# Patient Record
Sex: Male | Born: 1955 | Race: White | Hispanic: No | Smoking: Never smoker
Health system: Southern US, Community
[De-identification: ages and names within clinical notes are randomized; demographics above are authoritative.]

## PROBLEM LIST (undated history)

## (undated) DIAGNOSIS — C9500 Acute leukemia of unspecified cell type not having achieved remission: Secondary | ICD-10-CM

## (undated) DIAGNOSIS — E785 Hyperlipidemia, unspecified: Secondary | ICD-10-CM

## (undated) DIAGNOSIS — E119 Type 2 diabetes mellitus without complications: Secondary | ICD-10-CM

## (undated) DIAGNOSIS — I10 Essential (primary) hypertension: Secondary | ICD-10-CM

## (undated) HISTORY — PX: KNEE SURGERY: SHX244

## (undated) HISTORY — PX: SHOULDER SURGERY: SHX246

---

## 2015-12-13 ENCOUNTER — Emergency Department (HOSPITAL_BASED_OUTPATIENT_CLINIC_OR_DEPARTMENT_OTHER)
Admission: EM | Admit: 2015-12-13 | Discharge: 2015-12-14 | Disposition: A | Discharge status: Home / Self Care | Payer: Self-pay | Attending: Emergency Medicine | Admitting: Emergency Medicine

## 2015-12-13 ENCOUNTER — Emergency Department (HOSPITAL_BASED_OUTPATIENT_CLINIC_OR_DEPARTMENT_OTHER): Payer: Self-pay

## 2015-12-13 ENCOUNTER — Encounter (HOSPITAL_BASED_OUTPATIENT_CLINIC_OR_DEPARTMENT_OTHER): Payer: Self-pay | Admitting: Emergency Medicine

## 2015-12-13 DIAGNOSIS — Z7984 Long term (current) use of oral hypoglycemic drugs: Secondary | ICD-10-CM | POA: Insufficient documentation

## 2015-12-13 DIAGNOSIS — Z856 Personal history of leukemia: Secondary | ICD-10-CM | POA: Insufficient documentation

## 2015-12-13 DIAGNOSIS — J069 Acute upper respiratory infection, unspecified: Secondary | ICD-10-CM | POA: Insufficient documentation

## 2015-12-13 DIAGNOSIS — E119 Type 2 diabetes mellitus without complications: Secondary | ICD-10-CM | POA: Insufficient documentation

## 2015-12-13 DIAGNOSIS — E785 Hyperlipidemia, unspecified: Secondary | ICD-10-CM | POA: Insufficient documentation

## 2015-12-13 DIAGNOSIS — I10 Essential (primary) hypertension: Secondary | ICD-10-CM | POA: Insufficient documentation

## 2015-12-13 DIAGNOSIS — R000 Tachycardia, unspecified: Secondary | ICD-10-CM | POA: Insufficient documentation

## 2015-12-13 DIAGNOSIS — Z79899 Other long term (current) drug therapy: Secondary | ICD-10-CM | POA: Insufficient documentation

## 2015-12-13 DIAGNOSIS — Z794 Long term (current) use of insulin: Secondary | ICD-10-CM | POA: Insufficient documentation

## 2015-12-13 DIAGNOSIS — R062 Wheezing: Secondary | ICD-10-CM | POA: Insufficient documentation

## 2015-12-13 HISTORY — DX: Essential (primary) hypertension: I10

## 2015-12-13 HISTORY — DX: Hyperlipidemia, unspecified: E78.5

## 2015-12-13 HISTORY — DX: Acute leukemia of unspecified cell type not having achieved remission: C95.00

## 2015-12-13 HISTORY — DX: Type 2 diabetes mellitus without complications: E11.9

## 2015-12-13 LAB — URINE MICROSCOPIC-ADD ON

## 2015-12-13 LAB — URINALYSIS, ROUTINE W REFLEX MICROSCOPIC
Bilirubin Urine: NEGATIVE
Hgb urine dipstick: NEGATIVE
KETONES UR: NEGATIVE mg/dL
LEUKOCYTES UA: NEGATIVE
NITRITE: NEGATIVE
PH: 5.5 (ref 5.0–8.0)
Protein, ur: NEGATIVE mg/dL
SPECIFIC GRAVITY, URINE: 1.044 — AB (ref 1.005–1.030)

## 2015-12-13 MED ORDER — IPRATROPIUM-ALBUTEROL 0.5-2.5 (3) MG/3ML IN SOLN
3.0000 mL | Freq: Four times a day (QID) | RESPIRATORY_TRACT | Status: DC
  Administered 2015-12-13: 3 mL via RESPIRATORY_TRACT
  Filled 2015-12-13: qty 3

## 2015-12-13 NOTE — ED Provider Notes (Signed)
CSN: [id number]     Arrival date & time 12/13/15  2037 History   First MD Initiated Contact with Patient 12/13/15 2300     Chief Complaint  Patient presents with  . Cough  . Nasal Congestion   HPI  Russell Norton is a 60 year old male with PMHx of HTN, HLD, DM and leukemia in remission presenting with multiple complaints. He is reporting nasal congestion and rhinorrhea for the past 3 days. He denies purulent nasal discharge. He is also complaining of sinus pressure below his eyes bilaterally. He also complains of a generalized, throbbing headache. He has also been experiencing a cough. The cough is not productive of sputum. He states that his chest feels like it is congested. He states that he is having generalized chest heaviness with his cough. He is also endorsing wheezing and shortness of breath when he lies down to sleep. He states that lying flat down on his back makes the chest heaviness and SOB worse. He denies a history of CHF, asthma or wheezing. He is also complaining of chills but no fever. He has been taking over-the-counter medications such as Tylenol cold and flu and Mucinex. He reports moderate relief with these medications but states that the symptoms return. He recently moved to the area and does not have a primary care physician. Denies fevers, dizziness, lightheadedness, syncope, eye pain, visual disturbances, ear pain, sore throat, neck pain, neck stiffness, palpitations, abdominal pain, nausea, vomiting, diarrhea, dysuria, myalgias, arthralgias or rash.  Past Medical History  Diagnosis Date  . Hypertension   . Hyperlipidemia   . Diabetes mellitus without complication ([place])   . Leukemia, acute [facility])    Past Surgical History  Procedure Laterality Date  . Knee surgery Bilateral   . Shoulder surgery     No family history on file. Social History  Substance Use Topics  . Smoking status: Never Smoker   . Smokeless tobacco: None  . Alcohol Use: No    Review of Systems   Constitutional: Positive for chills. Negative for fever, activity change and appetite change.  HENT: Positive for congestion, rhinorrhea and sinus pressure. Negative for ear pain, sore throat and trouble swallowing.   Eyes: Negative for pain and visual disturbance.  Respiratory: Positive for cough, chest tightness (heaviness), shortness of breath and wheezing.   Cardiovascular: Negative for chest pain and palpitations.  Gastrointestinal: Negative for nausea, vomiting, abdominal pain and diarrhea.  Genitourinary: Negative for dysuria.  Musculoskeletal: Negative for myalgias, arthralgias, neck pain and neck stiffness.  Skin: Negative for rash.  Neurological: Positive for headaches. Negative for dizziness, syncope, weakness and light-headedness.      Allergies  Tape  Home Medications   Prior to Admission medications   Medication Sig Start Date End Date Taking? Authorizing Provider  acetaminophen (TYLENOL) 500 MG tablet Take 500 mg by mouth every 6 (six) hours as needed.   Yes Historical Provider, MD  atorvastatin (LIPITOR) 10 MG tablet Take 10 mg by mouth daily.   Yes Historical Provider, MD  insulin glargine (LANTUS) 100 UNIT/ML injection Inject 60 Units into the skin at bedtime.   Yes Historical Provider, MD  metFORMIN (GLUCOPHAGE) 500 MG tablet Take by mouth 2 (two) times daily with a meal.   Yes Historical Provider, MD  albuterol (PROVENTIL HFA;VENTOLIN HFA) 108 (90 Base) MCG/ACT inhaler Inhale 1-2 puffs into the lungs every 6 (six) hours as needed for wheezing or shortness of breath. 12/14/15   Tiyah Zelenak, PA-C  predniSONE (DELTASONE) 50 MG  tablet Take 1 tablet once a day for 5 days. 12/14/15   Merville Hijazi, PA-C   BP 109/79 mmHg  Pulse 109  Temp(Src) 99.3 F (37.4 C) (Oral)  Resp 26  Ht 5\' 8"  (1.727 m)  Wt 110.224 kg  BMI 36.96 kg/m2  SpO2 92% Physical Exam  Constitutional: He appears well-developed and well-nourished. No distress.  HENT:  Head: Normocephalic and  atraumatic.  Nose: Mucosal edema present. Right sinus exhibits maxillary sinus tenderness. Right sinus exhibits no frontal sinus tenderness. Left sinus exhibits maxillary sinus tenderness. Left sinus exhibits no frontal sinus tenderness.  Mouth/Throat: Uvula is midline, oropharynx is clear and moist and mucous membranes are normal. No uvula swelling. No oropharyngeal exudate, posterior oropharyngeal edema or posterior oropharyngeal erythema.  Eyes: Conjunctivae and EOM are normal. Right eye exhibits no discharge. Left eye exhibits no discharge. No scleral icterus.  Neck: Normal range of motion. Neck supple.  Cardiovascular: Normal rate, regular rhythm and normal heart sounds.   Pulmonary/Chest: Effort normal. No respiratory distress. He has wheezes.  Breathing unlabored. No accessory muscle usage. Diffuse wheezing throughout lung fields.   Abdominal: Soft. There is no tenderness. There is no rebound and no guarding.  Musculoskeletal: Normal range of motion.  Lymphadenopathy:    He has no cervical adenopathy.  Neurological: He is alert. Coordination normal.  Skin: Skin is warm and dry.  Psychiatric: He has a normal mood and affect. His behavior is normal.  Nursing note and vitals reviewed.   ED Course  Procedures (including critical care time) Labs Review Labs Reviewed  CBC WITH DIFFERENTIAL/PLATELET - Abnormal; Notable for the following:    WBC 11.3 (*)    Monocytes Absolute 1.1 (*)    All other components within normal limits  BASIC METABOLIC PANEL - Abnormal; Notable for the following:    Glucose, Bld 254 (*)    All other components within normal limits  URINALYSIS, ROUTINE W REFLEX MICROSCOPIC (NOT AT [facility]) - Abnormal; Notable for the following:    Specific Gravity, Urine 1.044 (*)    Glucose, UA >1000 (*)    All other components within normal limits  URINE MICROSCOPIC-ADD ON - Abnormal; Notable for the following:    Squamous Epithelial / LPF 0-5 (*)    Bacteria, UA RARE (*)     All other components within normal limits  TROPONIN I  BRAIN NATRIURETIC PEPTIDE    Imaging Review Dg Neck Soft Tissue  12/14/2015  CLINICAL DATA:  60 year old male with shortness of breath and wheezing EXAM: NECK SOFT TISSUES - 1+ VIEW COMPARISON:  None. FINDINGS: There is no evidence of retropharyngeal soft tissue swelling or epiglottic enlargement. The cervical airway is unremarkable and no radio-opaque foreign body identified. IMPRESSION: Negative. Electronically Signed   By: Anner Crete M.D.   On: 12/14/2015 03:07   Dg Chest 2 View  12/13/2015  CLINICAL DATA:  Acute onset of chest heaviness, cough and shortness of breath. Initial encounter. EXAM: CHEST  2 VIEW COMPARISON:  None. FINDINGS: The lungs are well-aerated. Mild vascular congestion is noted. Peribronchial thickening is seen. There is no evidence of focal opacification, pleural effusion or pneumothorax. The heart is normal in size; the mediastinal contour is within normal limits. No acute osseous abnormalities are seen. IMPRESSION: Mild vascular congestion noted. Peribronchial thickening seen. Lungs otherwise grossly clear. Electronically Signed   By: Garald Balding M.D.   On: 12/13/2015 21:20   I have personally reviewed and evaluated these images and lab results as part of my  medical decision-making.   EKG Interpretation   Date/Time:  Saturday December 13 2015 23:54:13 EST Ventricular Rate:  101 PR Interval:  165 QRS Duration: 93 QT Interval:  341 QTC Calculation: 442 R Axis:   -44 Text Interpretation:  Sinus tachycardia Left axis deviation Abnormal  R-wave progression, late transition ED PHYSICIAN INTERPRETATION AVAILABLE  IN [facility] Confirmed by TEST, Record ([id number]) on 12/14/2015 11:05:37  AM      MDM   Final diagnoses:  URI (upper respiratory infection)  Wheezing   60 year old male presenting with chills, congestion, rhinorrhea, cough, chest congestion and heaviness and increased SOB x 3 days.  Symptoms well controlled at home with OTC medications. Afebrile. Mildly tachycardic to 104. O2 96%. Pt is nontoxic appearing in no acute distress. Nasal congestion noted. Oropharynx without erythema, edema or exudate. Maxillary sinus tenderness noted. FROM of neck. Diffuse wheezing in all lung fields. Breathing unlabored and no respiratory distress noted. CXR without consolidation. WBC 11.3. Remaining blood work unremarkable. ECG NSR. Given duoneb and 50 mg prednisone in ED. Pt reports decreased wheezing after 1st tx. Will given second duoneb. Pt presentation consistent with URI; likely a viral sinusitis. Will discharge home with albuterol, prednisone and symptomatic care. Given referral information for new PCP since pt is new to area and has no established medical care. Pt is to follow up with PCP in 2-3 days. Return precautions given in discharge paperwork and discussed with pt at bedside. Pt stable for discharge     Josephina Gip, PA-C 12/14/15 1141  April Palumbo, MD 12/14/15 2304

## 2015-12-13 NOTE — ED Notes (Signed)
Pt states "bad head cold" and cough for past 3 days, with no relief from OTC medications.  Pt states heaviness in his chest and SOB while laying down to sleep.  Pt also reports chills.

## 2015-12-14 ENCOUNTER — Emergency Department (HOSPITAL_BASED_OUTPATIENT_CLINIC_OR_DEPARTMENT_OTHER): Payer: Self-pay

## 2015-12-14 LAB — TROPONIN I

## 2015-12-14 LAB — CBC WITH DIFFERENTIAL/PLATELET
BASOS ABS: 0 10*3/uL (ref 0.0–0.1)
Basophils Relative: 0 %
Eosinophils Absolute: 0.3 10*3/uL (ref 0.0–0.7)
Eosinophils Relative: 2 %
HCT: 45.2 % (ref 39.0–52.0)
HEMOGLOBIN: 15.3 g/dL (ref 13.0–17.0)
LYMPHS ABS: 3.9 10*3/uL (ref 0.7–4.0)
Lymphocytes Relative: 34 %
MCH: 30 pg (ref 26.0–34.0)
MCHC: 33.8 g/dL (ref 30.0–36.0)
MCV: 88.6 fL (ref 78.0–100.0)
Monocytes Absolute: 1.1 10*3/uL — ABNORMAL HIGH (ref 0.1–1.0)
Monocytes Relative: 10 %
NEUTROS PCT: 54 %
Neutro Abs: 6 10*3/uL (ref 1.7–7.7)
Platelets: 212 10*3/uL (ref 150–400)
RBC: 5.1 MIL/uL (ref 4.22–5.81)
RDW: 13.1 % (ref 11.5–15.5)
WBC: 11.3 10*3/uL — AB (ref 4.0–10.5)

## 2015-12-14 LAB — BASIC METABOLIC PANEL
ANION GAP: 10 (ref 5–15)
BUN: 11 mg/dL (ref 6–20)
CHLORIDE: 101 mmol/L (ref 101–111)
CO2: 26 mmol/L (ref 22–32)
Calcium: 9 mg/dL (ref 8.9–10.3)
Creatinine, Ser: 0.7 mg/dL (ref 0.61–1.24)
GFR calc Af Amer: >60 mL/min (ref >60)
GLUCOSE: 254 mg/dL — AB (ref 65–99)
POTASSIUM: 4 mmol/L (ref 3.5–5.1)
Sodium: 137 mmol/L (ref 135–145)

## 2015-12-14 LAB — BRAIN NATRIURETIC PEPTIDE: B Natriuretic Peptide: 14.8 pg/mL (ref 0.0–100.0)

## 2015-12-14 MED ORDER — ALBUTEROL SULFATE HFA 108 (90 BASE) MCG/ACT IN AERS: 1.0000 | INHALATION_SPRAY | Freq: Four times a day (QID) | RESPIRATORY_TRACT | Status: AC | PRN

## 2015-12-14 MED ORDER — PREDNISONE 50 MG PO TABS: ORAL_TABLET | ORAL | Status: AC

## 2015-12-14 MED ORDER — PREDNISONE 50 MG PO TABS
50.0000 mg | ORAL_TABLET | Freq: Once | ORAL | Status: AC
  Administered 2015-12-14: 50 mg via ORAL
  Filled 2015-12-14: qty 1

## 2015-12-14 MED ORDER — IPRATROPIUM-ALBUTEROL 0.5-2.5 (3) MG/3ML IN SOLN
3.0000 mL | Freq: Four times a day (QID) | RESPIRATORY_TRACT | Status: DC
  Administered 2015-12-14: 3 mL via RESPIRATORY_TRACT
  Filled 2015-12-14: qty 3

## 2015-12-14 MED ORDER — ALBUTEROL SULFATE (2.5 MG/3ML) 0.083% IN NEBU
5.0000 mg | INHALATION_SOLUTION | Freq: Once | RESPIRATORY_TRACT | Status: AC
  Administered 2015-12-14: 5 mg via RESPIRATORY_TRACT
  Filled 2015-12-14: qty 6

## 2015-12-14 NOTE — ED Notes (Signed)
"  feels about the same, still wheezing, VSS, SPo2 low, EDP and RT aware/notified.

## 2015-12-14 NOTE — ED Notes (Signed)
Pt to xray, alert, NAD, calm, interactive. VSS.

## 2015-12-14 NOTE — ED Provider Notes (Signed)
Medical screening examination/treatment/procedure(s) were conducted as a shared visit with non-physician practitioner(s) and myself.  I personally evaluated the patient during the encounter.   EKG Interpretation None     Seen and examined  NCAT RRR Wheezes in upper lungs NABS FROM x 4   Date: 12/14/2015  Rate: 101  Rhythm: normal sinus rhythm  QRS Axis: normal  Intervals: normal  ST/T Wave abnormalities: normal  Conduction Disutrbances: none  Narrative Interpretation: unremarkable     Another treatment given.  Soft tissue neck ordered.  Normal.  Agree with [facility] plan  Ziyon Soltau, MD 12/14/15 [phone]

## 2015-12-14 NOTE — Discharge Instructions (Signed)
Use your inhaler for wheezing every 2 hours. Starting tomorrow (2/12), take one tablet of prednisone once a day for 5 days. Continue taking your OTC medications for symptom relief. Schedule a follow up appointment with a PCP in the area for a visit in 3-5 days.    Viral Infections A viral infection can be caused by different types of viruses.Most viral infections are not serious and resolve on their own. However, some infections may cause severe symptoms and may lead to further complications. SYMPTOMS Viruses can frequently cause: 1. Minor sore throat. 2. Aches and pains. 3. Headaches. 4. Runny nose. 5. Different types of rashes. 6. Watery eyes. 7. Tiredness. 8. Cough. 9. Loss of appetite. 10. Gastrointestinal infections, resulting in nausea, vomiting, and diarrhea. These symptoms do not respond to antibiotics because the infection is not caused by bacteria. However, you might catch a bacterial infection following the viral infection. This is sometimes called a "superinfection." Symptoms of such a bacterial infection may include: 1. Worsening sore throat with pus and difficulty swallowing. 2. Swollen neck glands. 3. Chills and a high or persistent fever. 4. Severe headache. 5. Tenderness over the sinuses. 6. Persistent overall ill feeling (malaise), muscle aches, and tiredness (fatigue). 7. Persistent cough. 8. Yellow, green, or brown mucus production with coughing. HOME CARE INSTRUCTIONS   Only take over-the-counter or prescription medicines for pain, discomfort, diarrhea, or fever as directed by your caregiver.  Drink enough water and fluids to keep your urine clear or pale yellow. Sports drinks can provide valuable electrolytes, sugars, and hydration.  Get plenty of rest and maintain proper nutrition. Soups and broths with crackers or rice are fine. SEEK IMMEDIATE MEDICAL CARE IF:   You have severe headaches, shortness of breath, chest pain, neck pain, or an unusual  rash.  You have uncontrolled vomiting, diarrhea, or you are unable to keep down fluids.  You or your child has an oral temperature above 102 F (38.9 C), not controlled by medicine.  Your baby is older than 3 months with a rectal temperature of 102 F (38.9 C) or higher.  Your baby is 5 months old or younger with a rectal temperature of 100.4 F (38 C) or higher. MAKE SURE YOU:   Understand these instructions.  Will watch your condition.  Will get help right away if you are not doing well or get worse.   This information is not intended to replace advice given to you by your health care provider. Make sure you discuss any questions you have with your health care provider.   Document Released: 07/28/2005 Document Revised: 01/10/2012 Document Reviewed: 03/26/2015 Elsevier Interactive Patient Education 2016 [employer].  How to Use an Inhaler Proper inhaler technique is very important. Good technique ensures that the medicine reaches the lungs. Poor technique results in depositing the medicine on the tongue and back of the throat rather than in the airways. If you do not use the inhaler with good technique, the medicine will not help you. STEPS TO FOLLOW IF USING AN INHALER WITHOUT AN EXTENSION TUBE 11. Remove the cap from the inhaler. 12. If you are using the inhaler for the first time, you will need to prime it. Shake the inhaler for 5 seconds and release four puffs into the air, away from your face. Ask your health care provider or pharmacist if you have questions about priming your inhaler. 13. Shake the inhaler for 5 seconds before each breath in (inhalation). 14. Position the inhaler so that the top of  the canister faces up. 15. Put your index finger on the top of the medicine canister. Your thumb supports the bottom of the inhaler. 16. Open your mouth. 17. Either place the inhaler between your teeth and place your lips tightly around the mouthpiece, or hold the inhaler 1-2  inches away from your open mouth. If you are unsure of which technique to use, ask your health care provider. 18. Breathe out (exhale) normally and as completely as possible. 19. Press the canister down with your index finger to release the medicine. 20. At the same time as the canister is pressed, inhale deeply and slowly until your lungs are completely filled. This should take 4-6 seconds. Keep your tongue down. 21. Hold the medicine in your lungs for 5-10 seconds (10 seconds is best). This helps the medicine get into the small airways of your lungs. 22. Breathe out slowly, through pursed lips. Whistling is an example of pursed lips. 23. Wait at least 15-30 seconds between puffs. Continue with the above steps until you have taken the number of puffs your health care provider has ordered. Do not use the inhaler more than your health care provider tells you. 24. Replace the cap on the inhaler. 25. Follow the directions from your health care provider or the inhaler insert for cleaning the inhaler. STEPS TO FOLLOW IF USING AN INHALER WITH AN EXTENSION (SPACER) 9. Remove the cap from the inhaler. 10. If you are using the inhaler for the first time, you will need to prime it. Shake the inhaler for 5 seconds and release four puffs into the air, away from your face. Ask your health care provider or pharmacist if you have questions about priming your inhaler. 11. Shake the inhaler for 5 seconds before each breath in (inhalation). 12. Place the open end of the spacer onto the mouthpiece of the inhaler. 13. Position the inhaler so that the top of the canister faces up and the spacer mouthpiece faces you. 14. Put your index finger on the top of the medicine canister. Your thumb supports the bottom of the inhaler and the spacer. 15. Breathe out (exhale) normally and as completely as possible. 16. Immediately after exhaling, place the spacer between your teeth and into your mouth. Close your lips tightly around  the spacer. 17. Press the canister down with your index finger to release the medicine. 18. At the same time as the canister is pressed, inhale deeply and slowly until your lungs are completely filled. This should take 4-6 seconds. Keep your tongue down and out of the way. 19. Hold the medicine in your lungs for 5-10 seconds (10 seconds is best). This helps the medicine get into the small airways of your lungs. Exhale. 20. Repeat inhaling deeply through the spacer mouthpiece. Again hold that breath for up to 10 seconds (10 seconds is best). Exhale slowly. If it is difficult to take this second deep breath through the spacer, breathe normally several times through the spacer. Remove the spacer from your mouth. 21. Wait at least 15-30 seconds between puffs. Continue with the above steps until you have taken the number of puffs your health care provider has ordered. Do not use the inhaler more than your health care provider tells you. 22. Remove the spacer from the inhaler, and place the cap on the inhaler. 23. Follow the directions from your health care provider or the inhaler insert for cleaning the inhaler and spacer. If you are using different kinds of inhalers, use your quick  relief medicine to open the airways 10-15 minutes before using a steroid if instructed to do so by your health care provider. If you are unsure which inhalers to use and the order of using them, ask your health care provider, nurse, or respiratory therapist. If you are using a steroid inhaler, always rinse your mouth with water after your last puff, then gargle and spit out the water. Do not swallow the water. AVOID:  Inhaling before or after starting the spray of medicine. It takes practice to coordinate your breathing with triggering the spray.  Inhaling through the nose (rather than the mouth) when triggering the spray. HOW TO DETERMINE IF YOUR INHALER IS FULL OR NEARLY EMPTY You cannot know when an inhaler is empty by  shaking it. A few inhalers are now being made with dose counters. Ask your health care provider for a prescription that has a dose counter if you feel you need that extra help. If your inhaler does not have a counter, ask your health care provider to help you determine the date you need to refill your inhaler. Write the refill date on a calendar or your inhaler canister. Refill your inhaler 7-10 days before it runs out. Be sure to keep an adequate supply of medicine. This includes making sure it is not expired, and that you have a spare inhaler.  SEEK MEDICAL CARE IF:   Your symptoms are only partially relieved with your inhaler.  You are having trouble using your inhaler.  You have some increase in phlegm. SEEK IMMEDIATE MEDICAL CARE IF:   You feel little or no relief with your inhalers. You are still wheezing and are feeling shortness of breath or tightness in your chest or both.  You have dizziness, headaches, or a fast heart rate.  You have chills, fever, or night sweats.  You have a noticeable increase in phlegm production, or there is blood in the phlegm. MAKE SURE YOU:   Understand these instructions.  Will watch your condition.  Will get help right away if you are not doing well or get worse.   This information is not intended to replace advice given to you by your health care provider. Make sure you discuss any questions you have with your health care provider.   Document Released: 10/15/2000 Document Revised: 08/08/2013 Document Reviewed: 05/17/2013 Elsevier Interactive Patient Education [employer].

## 2017-10-27 IMAGING — CR DG CHEST 2V
2 series · 2 of 2 positions shown · non-contrast
Comparison: None.

CLINICAL DATA: Acute onset of chest heaviness, cough and shortness
of breath. Initial encounter.

EXAM:
CHEST  2 VIEW

[w chest pa]
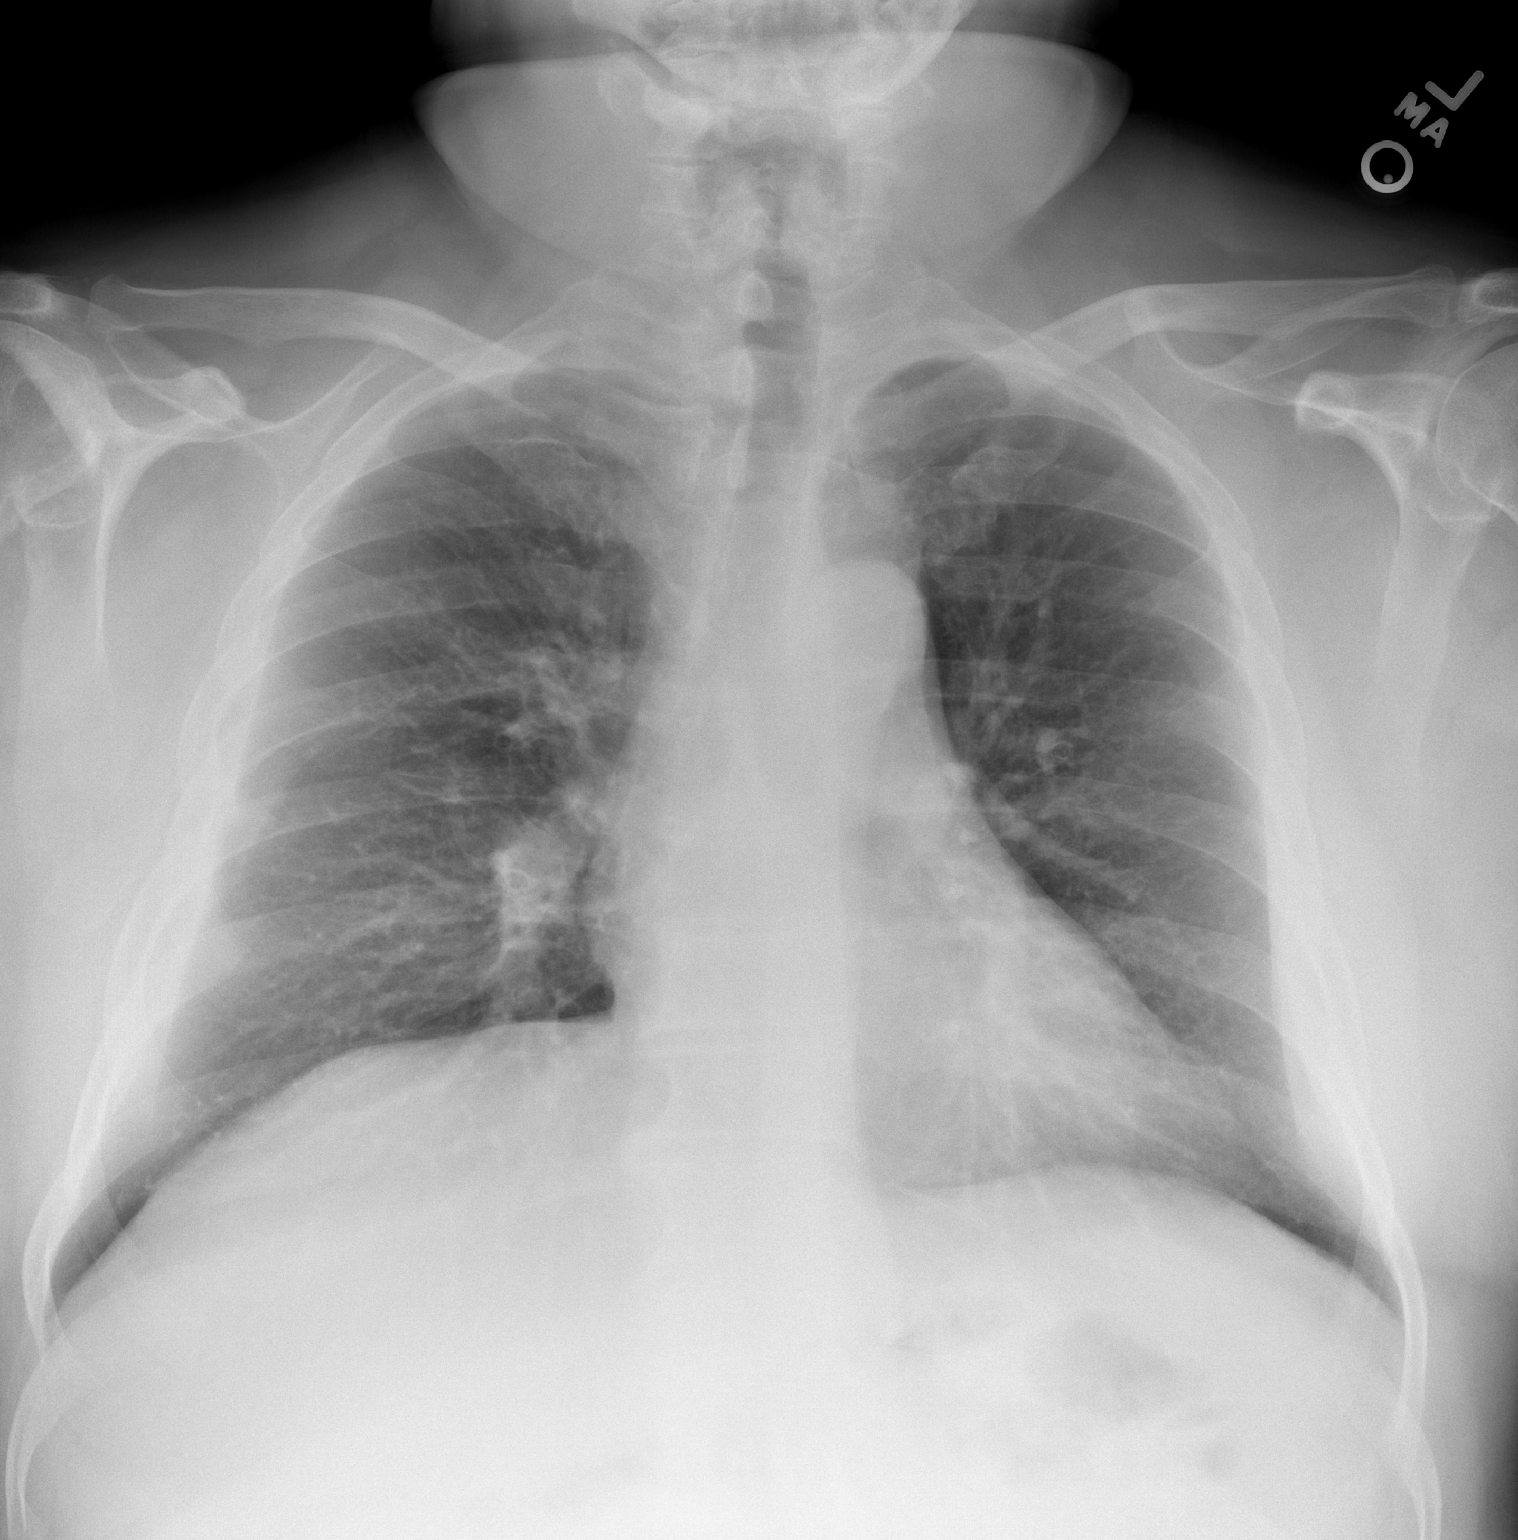

[w chest lat]
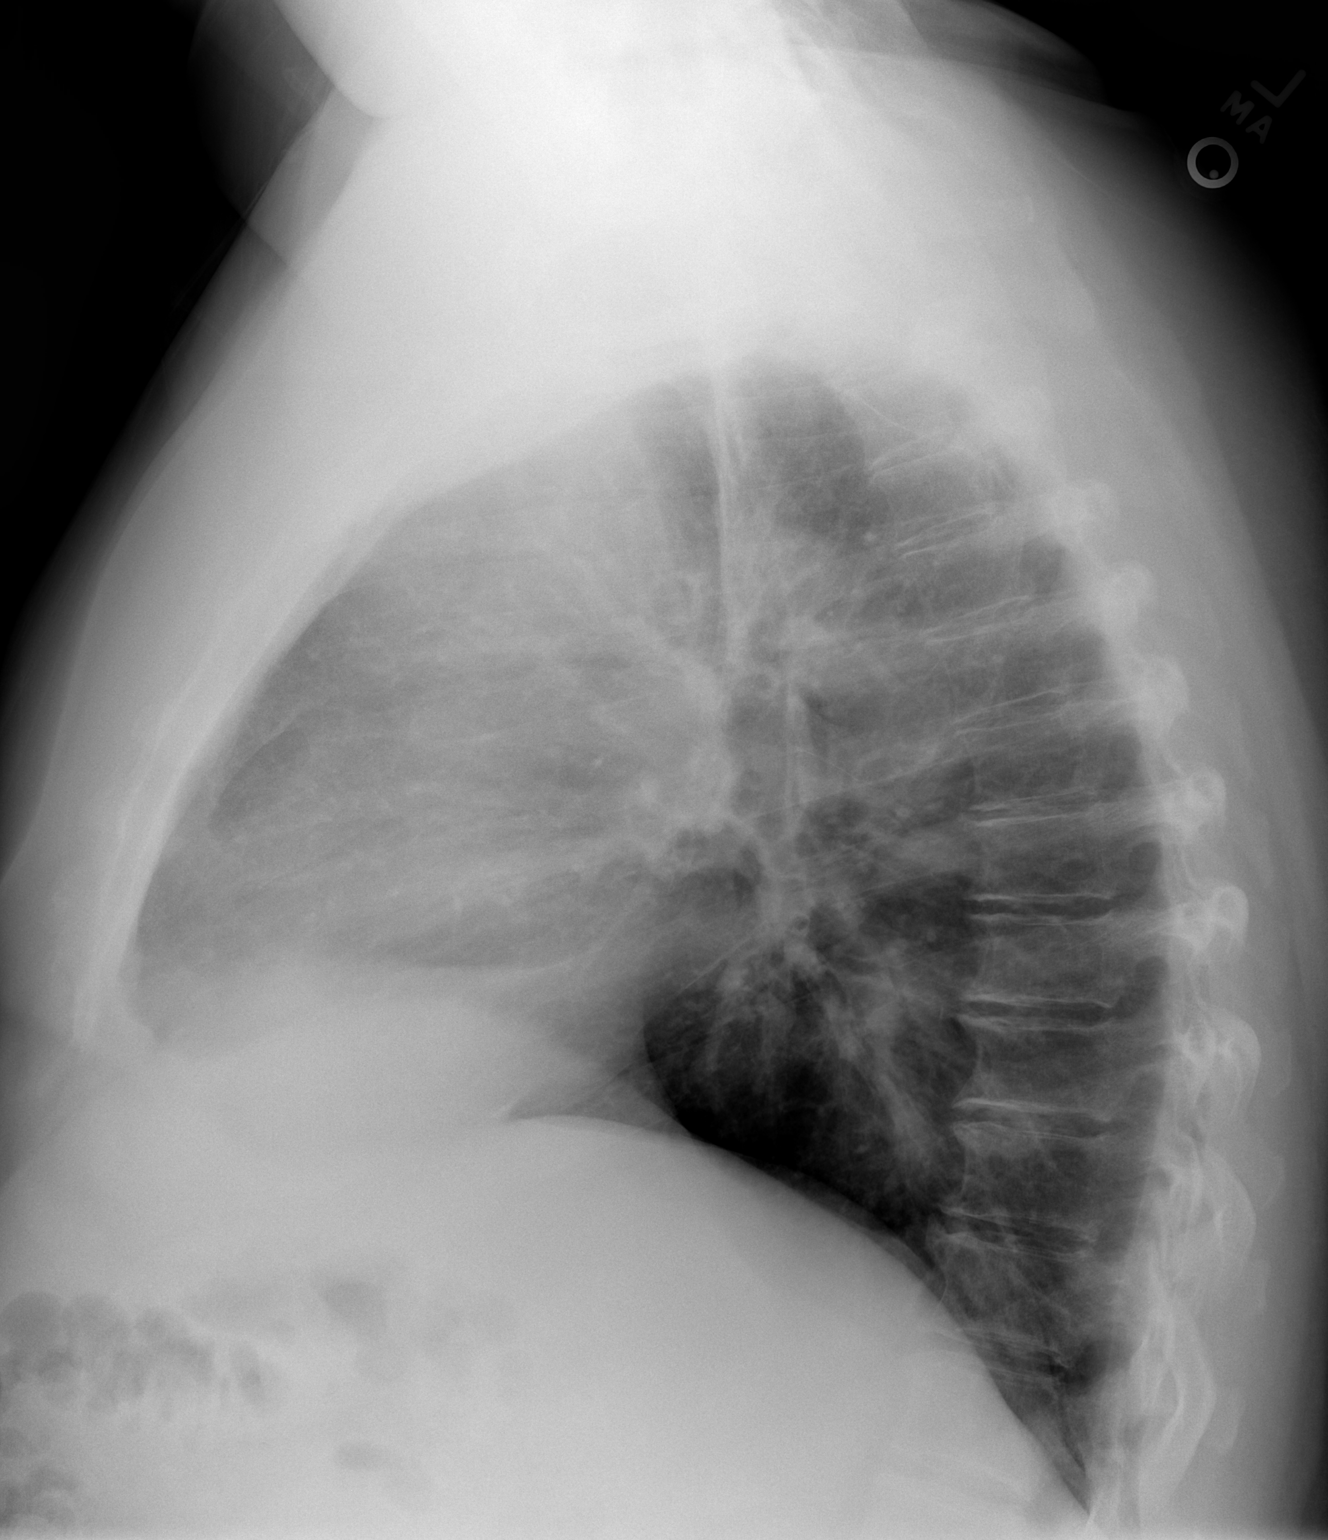

[2 of 2 positions shown; findings below may reference images not displayed]

FINDINGS: The lungs are well-aerated. Mild vascular congestion is noted.
Peribronchial thickening is seen. There is no evidence of focal
opacification, pleural effusion or pneumothorax.

The heart is normal in size; the mediastinal contour is within
normal limits. No acute osseous abnormalities are seen.
IMPRESSION: Mild vascular congestion noted. Peribronchial thickening seen. Lungs
otherwise grossly clear.

## 2017-10-28 IMAGING — CR DG NECK SOFT TISSUE
2 series · 2 of 2 positions shown · non-contrast
Comparison: None.

CLINICAL DATA: 59-year-old male with shortness of breath and
wheezing

EXAM:
NECK SOFT TISSUES - 1+ VIEW

[w soft tissue neck ap]
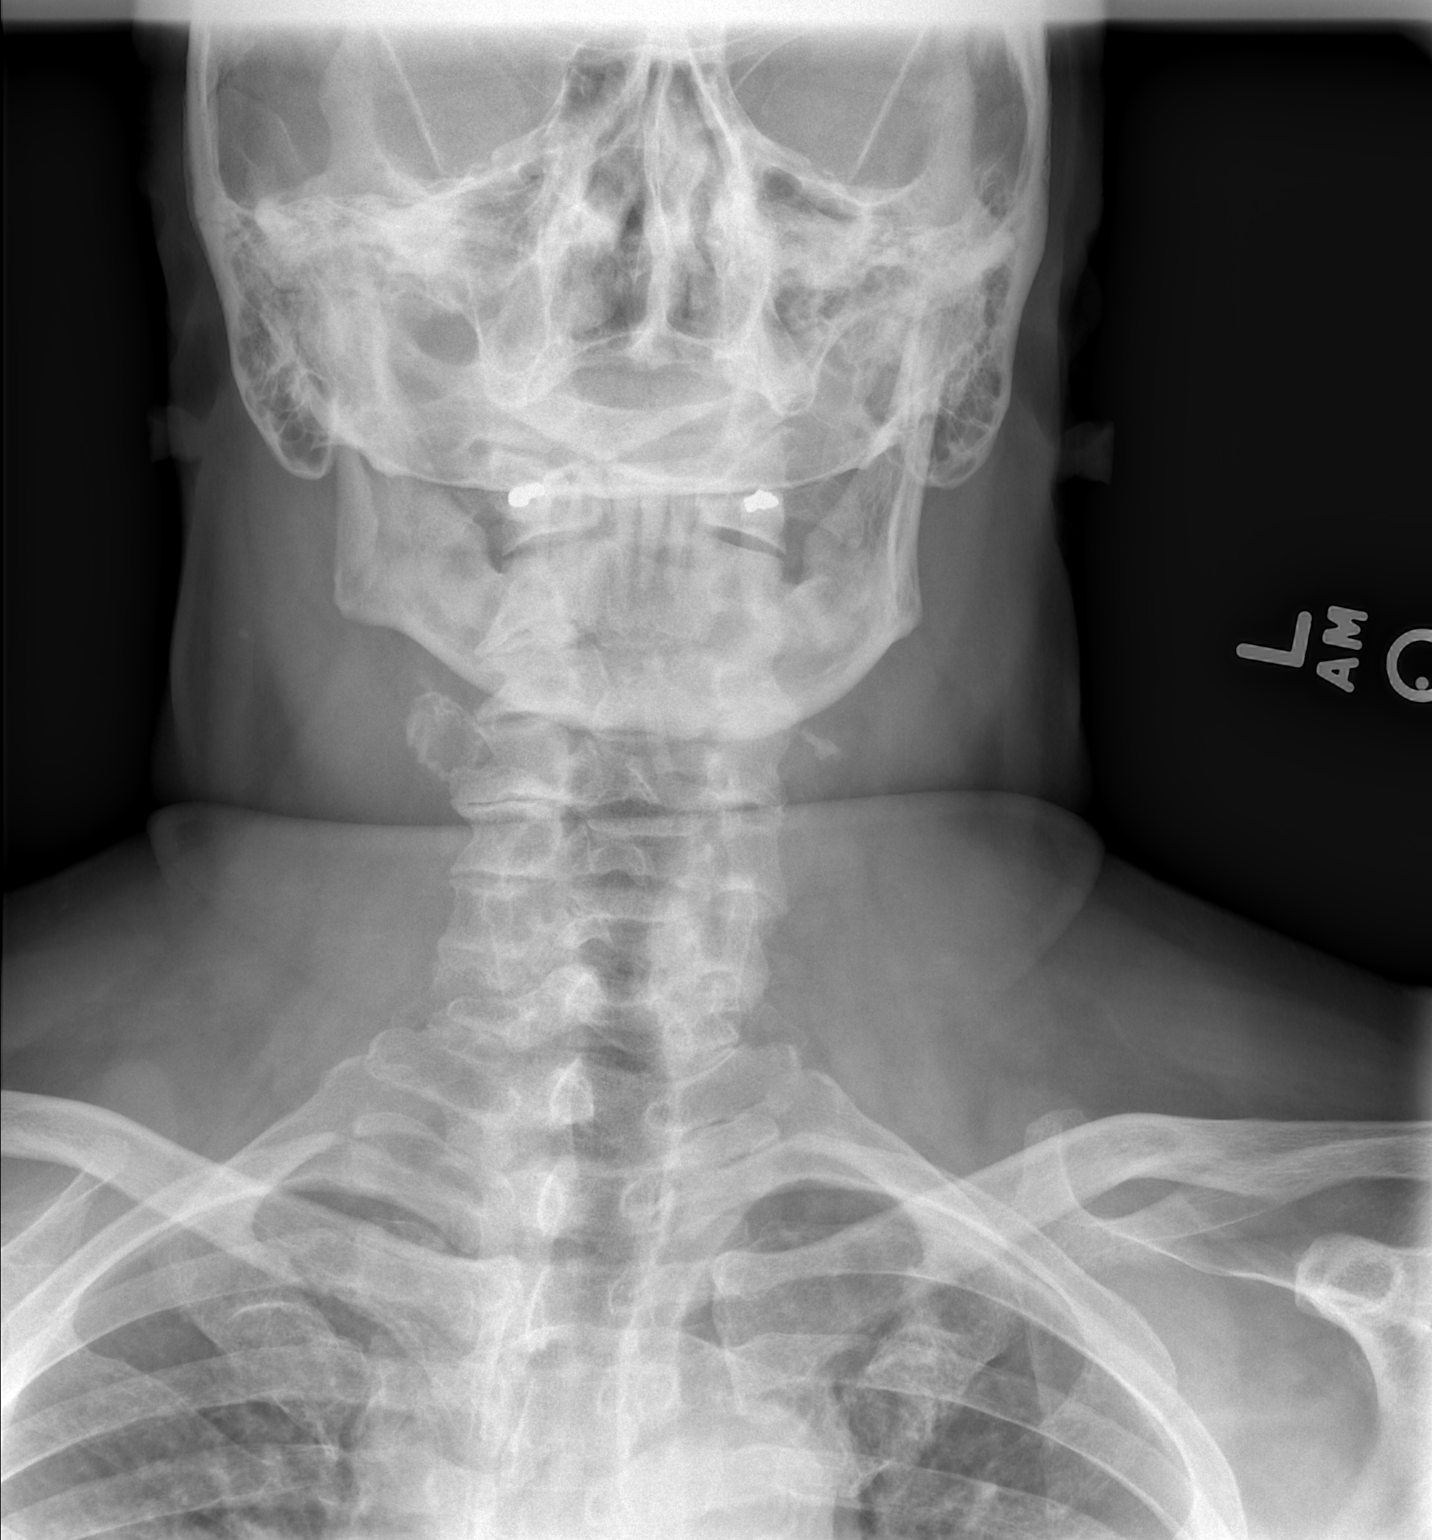

[w soft tissue neck]
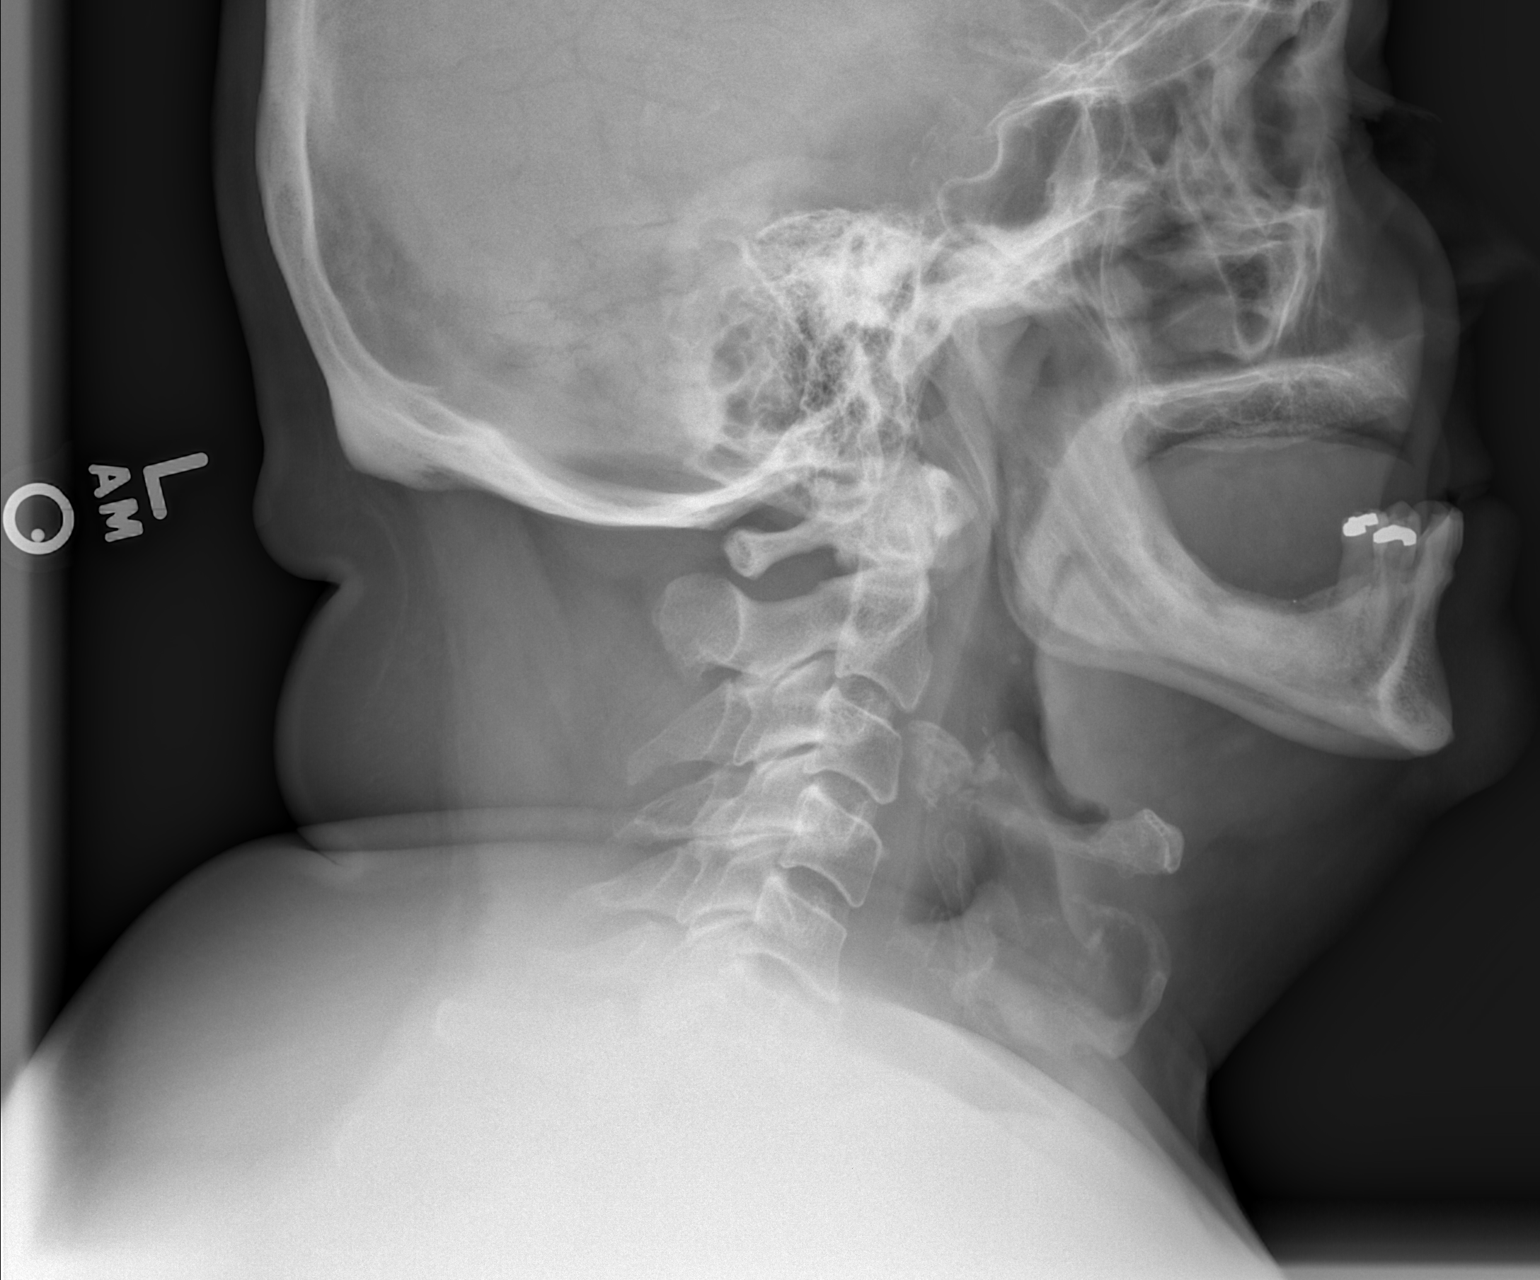

[2 of 2 positions shown; findings below may reference images not displayed]

FINDINGS: There is no evidence of retropharyngeal soft tissue swelling or
epiglottic enlargement. The cervical airway is unremarkable and no
radio-opaque foreign body identified.
IMPRESSION: Negative.
